# Patient Record
Sex: Male | Born: 1965 | Race: Black or African American | Hispanic: No | Marital: Single | Smoking: Current every day smoker
Health system: Southern US, Community
[De-identification: ages and names within clinical notes are randomized; demographics above are authoritative.]

---

## 2019-08-31 ENCOUNTER — Other Ambulatory Visit: Payer: Self-pay

## 2019-08-31 DIAGNOSIS — Z20822 Contact with and (suspected) exposure to covid-19: Secondary | ICD-10-CM

## 2019-09-03 LAB — NOVEL CORONAVIRUS, NAA: SARS-CoV-2, NAA: NOT DETECTED

## 2019-09-06 ENCOUNTER — Telehealth: Payer: Self-pay | Admitting: General Practice

## 2019-09-06 NOTE — Telephone Encounter (Signed)
Negative COVID results given. Patient results "NOT Detected." Caller expressed understanding. ° °

## 2020-05-30 ENCOUNTER — Inpatient Hospital Stay (HOSPITAL_COMMUNITY)
Admission: EM | Admit: 2020-05-30 | Discharge: 2020-06-06 | DRG: 488 | Disposition: A | Discharge status: Skilled Nursing Facility | Payer: Self-pay | Attending: Student | Admitting: Student

## 2020-05-30 ENCOUNTER — Encounter (HOSPITAL_COMMUNITY): Payer: Self-pay | Admitting: Orthopedic Surgery

## 2020-05-30 ENCOUNTER — Emergency Department (HOSPITAL_COMMUNITY): Payer: Self-pay

## 2020-05-30 DIAGNOSIS — S82142A Displaced bicondylar fracture of left tibia, initial encounter for closed fracture: Principal | ICD-10-CM | POA: Diagnosis present

## 2020-05-30 DIAGNOSIS — F17200 Nicotine dependence, unspecified, uncomplicated: Secondary | ICD-10-CM

## 2020-05-30 DIAGNOSIS — Y9301 Activity, walking, marching and hiking: Secondary | ICD-10-CM | POA: Diagnosis present

## 2020-05-30 DIAGNOSIS — S83282A Other tear of lateral meniscus, current injury, left knee, initial encounter: Secondary | ICD-10-CM

## 2020-05-30 DIAGNOSIS — R52 Pain, unspecified: Secondary | ICD-10-CM

## 2020-05-30 DIAGNOSIS — S8991XA Unspecified injury of right lower leg, initial encounter: Secondary | ICD-10-CM

## 2020-05-30 DIAGNOSIS — S82839A Other fracture of upper and lower end of unspecified fibula, initial encounter for closed fracture: Secondary | ICD-10-CM | POA: Diagnosis present

## 2020-05-30 DIAGNOSIS — M25361 Other instability, right knee: Secondary | ICD-10-CM | POA: Diagnosis present

## 2020-05-30 DIAGNOSIS — Z20822 Contact with and (suspected) exposure to covid-19: Secondary | ICD-10-CM | POA: Diagnosis present

## 2020-05-30 DIAGNOSIS — Y92410 Unspecified street and highway as the place of occurrence of the external cause: Secondary | ICD-10-CM

## 2020-05-30 DIAGNOSIS — T1490XA Injury, unspecified, initial encounter: Secondary | ICD-10-CM

## 2020-05-30 DIAGNOSIS — S8251XA Displaced fracture of medial malleolus of right tibia, initial encounter for closed fracture: Secondary | ICD-10-CM

## 2020-05-30 DIAGNOSIS — Z560 Unemployment, unspecified: Secondary | ICD-10-CM

## 2020-05-30 DIAGNOSIS — F17210 Nicotine dependence, cigarettes, uncomplicated: Secondary | ICD-10-CM | POA: Diagnosis present

## 2020-05-30 DIAGNOSIS — S82141A Displaced bicondylar fracture of right tibia, initial encounter for closed fracture: Secondary | ICD-10-CM | POA: Diagnosis present

## 2020-05-30 DIAGNOSIS — T148XXA Other injury of unspecified body region, initial encounter: Secondary | ICD-10-CM

## 2020-05-30 LAB — COMPREHENSIVE METABOLIC PANEL
ALT: 35 U/L (ref 0–44)
AST: 45 U/L — ABNORMAL HIGH (ref 15–41)
Albumin: 3.7 g/dL (ref 3.5–5.0)
Alkaline Phosphatase: 103 U/L (ref 38–126)
Anion gap: 11 (ref 5–15)
BUN: 5 mg/dL — ABNORMAL LOW (ref 6–20)
CO2: 22 mmol/L (ref 22–32)
Calcium: 8.7 mg/dL — ABNORMAL LOW (ref 8.9–10.3)
Chloride: 105 mmol/L (ref 98–111)
Creatinine, Ser: 0.93 mg/dL (ref 0.61–1.24)
GFR calc Af Amer: >60 mL/min (ref >60)
GFR calc non Af Amer: >60 mL/min (ref >60)
Glucose, Bld: 128 mg/dL — ABNORMAL HIGH (ref 70–99)
Potassium: 3.7 mmol/L (ref 3.5–5.1)
Sodium: 138 mmol/L (ref 135–145)
Total Bilirubin: 0.7 mg/dL (ref 0.3–1.2)
Total Protein: 7.1 g/dL (ref 6.5–8.1)

## 2020-05-30 LAB — CBC WITH DIFFERENTIAL/PLATELET
Abs Immature Granulocytes: 0.1 10*3/uL — ABNORMAL HIGH (ref 0.00–0.07)
Basophils Absolute: 0.1 10*3/uL (ref 0.0–0.1)
Basophils Relative: 0 %
Eosinophils Absolute: 0.2 10*3/uL (ref 0.0–0.5)
Eosinophils Relative: 2 %
HCT: 41.7 % (ref 39.0–52.0)
Hemoglobin: 13 g/dL (ref 13.0–17.0)
Immature Granulocytes: 1 %
Lymphocytes Relative: 33 %
Lymphs Abs: 4.5 10*3/uL — ABNORMAL HIGH (ref 0.7–4.0)
MCH: 25.4 pg — ABNORMAL LOW (ref 26.0–34.0)
MCHC: 31.2 g/dL (ref 30.0–36.0)
MCV: 81.4 fL (ref 80.0–100.0)
Monocytes Absolute: 0.8 10*3/uL (ref 0.1–1.0)
Monocytes Relative: 6 %
Neutro Abs: 7.8 10*3/uL — ABNORMAL HIGH (ref 1.7–7.7)
Neutrophils Relative %: 58 %
Platelets: 119 10*3/uL — ABNORMAL LOW (ref 150–400)
RBC: 5.12 MIL/uL (ref 4.22–5.81)
RDW: 15.4 % (ref 11.5–15.5)
WBC: 13.4 10*3/uL — ABNORMAL HIGH (ref 4.0–10.5)
nRBC: 0 % (ref 0.0–0.2)

## 2020-05-30 LAB — SURGICAL PCR SCREEN
MRSA, PCR: NEGATIVE
Staphylococcus aureus: NEGATIVE

## 2020-05-30 LAB — SARS CORONAVIRUS 2 BY RT PCR (HOSPITAL ORDER, PERFORMED IN ~~LOC~~ HOSPITAL LAB): SARS Coronavirus 2: NEGATIVE

## 2020-05-30 MED ORDER — MORPHINE SULFATE (PF) 4 MG/ML IV SOLN
4.0000 mg | Freq: Once | INTRAVENOUS | Status: AC
  Administered 2020-05-30: 4 mg via INTRAVENOUS
  Filled 2020-05-30: qty 1

## 2020-05-30 MED ORDER — ONDANSETRON HCL 4 MG PO TABS: 4.0000 mg | ORAL_TABLET | Freq: Four times a day (QID) | ORAL | Status: DC | PRN

## 2020-05-30 MED ORDER — OXYCODONE HCL 5 MG PO TABS
5.0000 mg | ORAL_TABLET | ORAL | Status: DC | PRN
  Administered 2020-05-30: 15 mg via ORAL
  Administered 2020-05-30: 10 mg via ORAL
  Administered 2020-05-30: 15 mg via ORAL
  Administered 2020-05-31: 10 mg via ORAL
  Administered 2020-05-31 (×2): 15 mg via ORAL
  Administered 2020-06-01 (×3): 10 mg via ORAL
  Administered 2020-06-01: 15 mg via ORAL
  Administered 2020-06-02: 10 mg via ORAL
  Administered 2020-06-02 – 2020-06-03 (×2): 15 mg via ORAL
  Administered 2020-06-03 – 2020-06-05 (×4): 10 mg via ORAL
  Filled 2020-05-30 (×3): qty 3
  Filled 2020-05-30 (×3): qty 2
  Filled 2020-05-30: qty 3
  Filled 2020-05-30: qty 2
  Filled 2020-05-30: qty 3
  Filled 2020-05-30: qty 2
  Filled 2020-05-30: qty 3
  Filled 2020-05-30 (×2): qty 2
  Filled 2020-05-30: qty 3
  Filled 2020-05-30: qty 2
  Filled 2020-05-30: qty 3
  Filled 2020-05-30: qty 2

## 2020-05-30 MED ORDER — ENSURE PRE-SURGERY PO LIQD
296.0000 mL | Freq: Once | ORAL | Status: DC
  Filled 2020-05-30: qty 296

## 2020-05-30 MED ORDER — CHLORHEXIDINE GLUCONATE 4 % EX LIQD
60.0000 mL | Freq: Once | CUTANEOUS | Status: AC
  Administered 2020-05-31: 4 via TOPICAL
  Filled 2020-05-30: qty 60

## 2020-05-30 MED ORDER — MORPHINE SULFATE (PF) 2 MG/ML IV SOLN
2.0000 mg | INTRAVENOUS | Status: DC | PRN
  Administered 2020-05-30 – 2020-06-01 (×3): 2 mg via INTRAVENOUS
  Filled 2020-05-30 (×3): qty 1

## 2020-05-30 MED ORDER — METHOCARBAMOL 500 MG PO TABS
500.0000 mg | ORAL_TABLET | Freq: Four times a day (QID) | ORAL | Status: DC | PRN
  Administered 2020-05-31 – 2020-06-05 (×4): 500 mg via ORAL
  Filled 2020-05-30 (×4): qty 1

## 2020-05-30 MED ORDER — CEFAZOLIN SODIUM-DEXTROSE 2-4 GM/100ML-% IV SOLN
2.0000 g | INTRAVENOUS | Status: AC
  Administered 2020-05-31: 2 g via INTRAVENOUS
  Filled 2020-05-30: qty 100

## 2020-05-30 MED ORDER — ONDANSETRON HCL 4 MG/2ML IJ SOLN: 4.0000 mg | Freq: Four times a day (QID) | INTRAMUSCULAR | Status: DC | PRN

## 2020-05-30 MED ORDER — METHOCARBAMOL 1000 MG/10ML IJ SOLN
500.0000 mg | Freq: Four times a day (QID) | INTRAVENOUS | Status: DC | PRN
  Filled 2020-05-30: qty 5

## 2020-05-30 MED ORDER — POVIDONE-IODINE 10 % EX SWAB: 2.0000 "application " | Freq: Once | CUTANEOUS | Status: DC

## 2020-05-30 MED ORDER — ENOXAPARIN SODIUM 40 MG/0.4ML ~~LOC~~ SOLN
40.0000 mg | SUBCUTANEOUS | Status: DC
  Administered 2020-05-30: 40 mg via SUBCUTANEOUS
  Filled 2020-05-30: qty 0.4

## 2020-05-30 NOTE — Progress Notes (Signed)
Orthopedic Tech Progress Note Patient Details:  Aaron Mcmillan 05/04/1966 [id number]  Ortho Devices Type of Ortho Device: Knee Immobilizer, CAM walker Ortho Device/Splint Location: RLE Ortho Device/Splint Interventions: Ordered, Application   Post Interventions Patient Tolerated: Well Instructions Provided: Care of device   [name] 05/30/2020, 3:13 PM

## 2020-05-30 NOTE — ED Notes (Signed)
Ortho at bedside.
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
## 2020-05-30 NOTE — Plan of Care (Signed)
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
## 2020-05-30 NOTE — ED Notes (Signed)
Patient transported to CT 

## 2020-05-31 ENCOUNTER — Inpatient Hospital Stay (HOSPITAL_COMMUNITY): Payer: Self-pay | Admitting: Certified Registered"

## 2020-05-31 ENCOUNTER — Encounter (HOSPITAL_COMMUNITY): Payer: Self-pay | Admitting: Student

## 2020-05-31 ENCOUNTER — Inpatient Hospital Stay (HOSPITAL_COMMUNITY): Payer: Self-pay

## 2020-05-31 ENCOUNTER — Encounter (HOSPITAL_COMMUNITY)
Admission: EM | Disposition: A | Discharge status: Skilled Nursing Facility | Payer: Self-pay | Source: Home / Self Care | Attending: Student

## 2020-05-31 ENCOUNTER — Other Ambulatory Visit: Payer: Self-pay

## 2020-05-31 DIAGNOSIS — S83282A Other tear of lateral meniscus, current injury, left knee, initial encounter: Secondary | ICD-10-CM

## 2020-05-31 DIAGNOSIS — F17200 Nicotine dependence, unspecified, uncomplicated: Secondary | ICD-10-CM

## 2020-05-31 DIAGNOSIS — S8251XA Displaced fracture of medial malleolus of right tibia, initial encounter for closed fracture: Secondary | ICD-10-CM

## 2020-05-31 DIAGNOSIS — S8991XA Unspecified injury of right lower leg, initial encounter: Secondary | ICD-10-CM

## 2020-05-31 HISTORY — PX: ORIF TIBIA PLATEAU: SHX2132

## 2020-05-31 LAB — VITAMIN D 25 HYDROXY (VIT D DEFICIENCY, FRACTURES): Vit D, 25-Hydroxy: 12.44 ng/mL — ABNORMAL LOW (ref 30–100)

## 2020-05-31 LAB — HIV ANTIBODY (ROUTINE TESTING W REFLEX): HIV Screen 4th Generation wRfx: NONREACTIVE

## 2020-05-31 SURGERY — OPEN REDUCTION INTERNAL FIXATION (ORIF) TIBIAL PLATEAU
Anesthesia: General | Laterality: Left

## 2020-05-31 MED ORDER — 0.9 % SODIUM CHLORIDE (POUR BTL) OPTIME
TOPICAL | Status: DC | PRN
  Administered 2020-05-31: 1000 mL

## 2020-05-31 MED ORDER — ALBUMIN HUMAN 5 % IV SOLN: INTRAVENOUS | Status: DC | PRN

## 2020-05-31 MED ORDER — ONDANSETRON HCL 4 MG/2ML IJ SOLN
INTRAMUSCULAR | Status: DC | PRN
  Administered 2020-05-31: 4 mg via INTRAVENOUS

## 2020-05-31 MED ORDER — PHENYLEPHRINE HCL (PRESSORS) 10 MG/ML IV SOLN
INTRAVENOUS | Status: DC | PRN
  Administered 2020-05-31: 80 ug via INTRAVENOUS

## 2020-05-31 MED ORDER — HYDROMORPHONE HCL 1 MG/ML IJ SOLN
INTRAMUSCULAR | Status: DC | PRN
Start: 2020-05-31 — End: 2020-05-31
  Administered 2020-05-31: .5 mg via INTRAVENOUS

## 2020-05-31 MED ORDER — OXYCODONE HCL 5 MG PO TABS
ORAL_TABLET | ORAL | Status: AC
  Filled 2020-05-31: qty 1

## 2020-05-31 MED ORDER — SUGAMMADEX SODIUM 200 MG/2ML IV SOLN
INTRAVENOUS | Status: DC | PRN
  Administered 2020-05-31: 300 mg via INTRAVENOUS

## 2020-05-31 MED ORDER — LACTATED RINGERS IV SOLN: INTRAVENOUS | Status: DC

## 2020-05-31 MED ORDER — ACETAMINOPHEN 10 MG/ML IV SOLN: 1000.0000 mg | Freq: Once | INTRAVENOUS | Status: DC | PRN

## 2020-05-31 MED ORDER — MIDAZOLAM HCL 2 MG/2ML IJ SOLN
INTRAMUSCULAR | Status: DC | PRN
  Administered 2020-05-31: 2 mg via INTRAVENOUS

## 2020-05-31 MED ORDER — FENTANYL CITRATE (PF) 100 MCG/2ML IJ SOLN
25.0000 ug | INTRAMUSCULAR | Status: DC | PRN
  Administered 2020-05-31 (×2): 50 ug via INTRAVENOUS

## 2020-05-31 MED ORDER — MIDAZOLAM HCL 2 MG/2ML IJ SOLN
INTRAMUSCULAR | Status: AC
  Filled 2020-05-31: qty 2

## 2020-05-31 MED ORDER — HYDROMORPHONE HCL 1 MG/ML IJ SOLN
INTRAMUSCULAR | Status: AC
  Filled 2020-05-31: qty 0.5

## 2020-05-31 MED ORDER — DEXMEDETOMIDINE (PRECEDEX) IN NS 20 MCG/5ML (4 MCG/ML) IV SYRINGE
PREFILLED_SYRINGE | INTRAVENOUS | Status: DC | PRN
  Administered 2020-05-31: 12 ug via INTRAVENOUS

## 2020-05-31 MED ORDER — LABETALOL HCL 5 MG/ML IV SOLN: 10.0000 mg | Freq: Once | INTRAVENOUS | Status: DC

## 2020-05-31 MED ORDER — LABETALOL HCL 5 MG/ML IV SOLN
10.0000 mg | INTRAVENOUS | Status: DC | PRN
  Administered 2020-05-31: 10 mg via INTRAVENOUS

## 2020-05-31 MED ORDER — ROCURONIUM BROMIDE 10 MG/ML (PF) SYRINGE
PREFILLED_SYRINGE | INTRAVENOUS | Status: DC | PRN
  Administered 2020-05-31 (×2): 20 mg via INTRAVENOUS
  Administered 2020-05-31: 50 mg via INTRAVENOUS

## 2020-05-31 MED ORDER — OXYCODONE HCL 5 MG PO TABS
5.0000 mg | ORAL_TABLET | Freq: Once | ORAL | Status: AC | PRN
  Administered 2020-05-31: 5 mg via ORAL

## 2020-05-31 MED ORDER — LABETALOL HCL 5 MG/ML IV SOLN
INTRAVENOUS | Status: AC
  Filled 2020-05-31: qty 4

## 2020-05-31 MED ORDER — PROMETHAZINE HCL 25 MG/ML IJ SOLN: 6.2500 mg | INTRAMUSCULAR | Status: DC | PRN

## 2020-05-31 MED ORDER — CHLORHEXIDINE GLUCONATE 0.12 % MT SOLN
15.0000 mL | OROMUCOSAL | Status: AC
  Administered 2020-05-31: 15 mL via OROMUCOSAL
  Filled 2020-05-31: qty 15

## 2020-05-31 MED ORDER — LIDOCAINE 2% (20 MG/ML) 5 ML SYRINGE
INTRAMUSCULAR | Status: DC | PRN
  Administered 2020-05-31: 100 mg via INTRAVENOUS

## 2020-05-31 MED ORDER — OXYCODONE HCL 5 MG/5ML PO SOLN: 5.0000 mg | Freq: Once | ORAL | Status: AC | PRN

## 2020-05-31 MED ORDER — CEFAZOLIN SODIUM-DEXTROSE 2-4 GM/100ML-% IV SOLN
2.0000 g | Freq: Three times a day (TID) | INTRAVENOUS | Status: AC
  Administered 2020-05-31 – 2020-06-01 (×3): 2 g via INTRAVENOUS
  Filled 2020-05-31 (×3): qty 100

## 2020-05-31 MED ORDER — VANCOMYCIN HCL 1000 MG IV SOLR
INTRAVENOUS | Status: DC | PRN
  Administered 2020-05-31: 1000 mg

## 2020-05-31 MED ORDER — ENOXAPARIN SODIUM 40 MG/0.4ML ~~LOC~~ SOLN
40.0000 mg | SUBCUTANEOUS | Status: DC
  Administered 2020-06-01 – 2020-06-06 (×6): 40 mg via SUBCUTANEOUS
  Filled 2020-05-31 (×6): qty 0.4

## 2020-05-31 MED ORDER — DEXAMETHASONE SODIUM PHOSPHATE 10 MG/ML IJ SOLN
INTRAMUSCULAR | Status: DC | PRN
  Administered 2020-05-31: 4 mg via INTRAVENOUS

## 2020-05-31 MED ORDER — FENTANYL CITRATE (PF) 250 MCG/5ML IJ SOLN
INTRAMUSCULAR | Status: DC | PRN
Start: 2020-05-31 — End: 2020-05-31
  Administered 2020-05-31 (×5): 50 ug via INTRAVENOUS

## 2020-05-31 MED ORDER — PROPOFOL 10 MG/ML IV BOLUS
INTRAVENOUS | Status: DC | PRN
  Administered 2020-05-31: 50 mg via INTRAVENOUS
  Administered 2020-05-31: 150 mg via INTRAVENOUS

## 2020-05-31 MED ORDER — FENTANYL CITRATE (PF) 100 MCG/2ML IJ SOLN
INTRAMUSCULAR | Status: AC
  Filled 2020-05-31: qty 2

## 2020-05-31 MED ORDER — TOBRAMYCIN SULFATE 1.2 G IJ SOLR
INTRAMUSCULAR | Status: AC
  Filled 2020-05-31: qty 1.2

## 2020-05-31 MED ORDER — PROPOFOL 10 MG/ML IV BOLUS
INTRAVENOUS | Status: AC
  Filled 2020-05-31: qty 20

## 2020-05-31 MED ORDER — FENTANYL CITRATE (PF) 250 MCG/5ML IJ SOLN
INTRAMUSCULAR | Status: AC
  Filled 2020-05-31: qty 5

## 2020-05-31 MED ORDER — VANCOMYCIN HCL 1000 MG IV SOLR
INTRAVENOUS | Status: AC
  Filled 2020-05-31: qty 1000

## 2020-05-31 MED ORDER — ROCURONIUM BROMIDE 10 MG/ML (PF) SYRINGE
PREFILLED_SYRINGE | INTRAVENOUS | Status: AC
  Filled 2020-05-31: qty 10

## 2020-05-31 SURGICAL SUPPLY — 83 items
BANDAGE ESMARK 6X9 LF (GAUZE/BANDAGES/DRESSINGS) ×1 IMPLANT
BIT DRILL 1.5 (BIT) ×1 IMPLANT
BIT DRILL 2.8X200 (BIT) ×2 IMPLANT
BIT DRILL CALIBR QC 2.8X250 (BIT) ×2 IMPLANT
BIT DRILL QC 2.5X240 (BIT) ×2 IMPLANT
BLADE CLIPPER SURG (BLADE) IMPLANT
BLADE SURG 15 STRL LF DISP TIS (BLADE) ×1 IMPLANT
BLADE SURG 15 STRL SS (BLADE) ×1
BNDG ELASTIC 4X5.8 VLCR STR LF (GAUZE/BANDAGES/DRESSINGS) ×2 IMPLANT
BNDG ELASTIC 6X5.8 VLCR STR LF (GAUZE/BANDAGES/DRESSINGS) ×2 IMPLANT
BNDG ESMARK 6X9 LF (GAUZE/BANDAGES/DRESSINGS) ×2
BNDG GAUZE ELAST 4 BULKY (GAUZE/BANDAGES/DRESSINGS) ×2 IMPLANT
BONE CANC CHIPS 20CC PCAN1/4 (Bone Implant) ×2 IMPLANT
BRUSH SCRUB EZ PLAIN DRY (MISCELLANEOUS) ×4 IMPLANT
CANISTER SUCT 3000ML PPV (MISCELLANEOUS) ×2 IMPLANT
CHIPS CANC BONE 20CC PCAN1/4 (Bone Implant) ×1 IMPLANT
CHLORAPREP W/TINT 26 (MISCELLANEOUS) ×4 IMPLANT
COVER SURGICAL LIGHT HANDLE (MISCELLANEOUS) ×2 IMPLANT
COVER WAND RF STERILE (DRAPES) ×2 IMPLANT
CUFF TOURN SGL QUICK 34 (TOURNIQUET CUFF) ×1
CUFF TRNQT CYL 34X4.125X (TOURNIQUET CUFF) ×1 IMPLANT
DRAPE C-ARM 42X72 X-RAY (DRAPES) ×2 IMPLANT
DRAPE C-ARMOR (DRAPES) ×2 IMPLANT
DRAPE ORTHO SPLIT 77X108 STRL (DRAPES) ×2
DRAPE SURG ORHT 6 SPLT 77X108 (DRAPES) ×2 IMPLANT
DRAPE U-SHAPE 47X51 STRL (DRAPES) ×2 IMPLANT
DRILL BIT 1.5 (BIT) ×1
DRSG MEPITEL 4X7.2 (GAUZE/BANDAGES/DRESSINGS) ×2 IMPLANT
DRSG PAD ABDOMINAL 8X10 ST (GAUZE/BANDAGES/DRESSINGS) ×4 IMPLANT
ELECT REM PT RETURN 9FT ADLT (ELECTROSURGICAL) ×2
ELECTRODE REM PT RTRN 9FT ADLT (ELECTROSURGICAL) ×1 IMPLANT
GAUZE SPONGE 4X4 12PLY STRL (GAUZE/BANDAGES/DRESSINGS) ×2 IMPLANT
GLOVE BIO SURGEON STRL SZ 6.5 (GLOVE) ×6 IMPLANT
GLOVE BIO SURGEON STRL SZ7.5 (GLOVE) ×8 IMPLANT
GLOVE BIOGEL PI IND STRL 6.5 (GLOVE) ×1 IMPLANT
GLOVE BIOGEL PI IND STRL 7.5 (GLOVE) ×1 IMPLANT
GLOVE BIOGEL PI INDICATOR 6.5 (GLOVE) ×1
GLOVE BIOGEL PI INDICATOR 7.5 (GLOVE) ×1
GOWN STRL REUS W/ TWL LRG LVL3 (GOWN DISPOSABLE) ×2 IMPLANT
GOWN STRL REUS W/TWL LRG LVL3 (GOWN DISPOSABLE) ×2
IMMOBILIZER KNEE 22 UNIV (SOFTGOODS) ×2 IMPLANT
KIT BASIN OR (CUSTOM PROCEDURE TRAY) ×2 IMPLANT
KIT TURNOVER KIT B (KITS) ×2 IMPLANT
NDL SUT 6 .5 CRC .975X.05 MAYO (NEEDLE) ×1 IMPLANT
NEEDLE MAYO TAPER (NEEDLE) ×1
NS IRRIG 1000ML POUR BTL (IV SOLUTION) ×2 IMPLANT
PACK TOTAL JOINT (CUSTOM PROCEDURE TRAY) ×2 IMPLANT
PAD ARMBOARD 7.5X6 YLW CONV (MISCELLANEOUS) ×4 IMPLANT
PAD CAST 4YDX4 CTTN HI CHSV (CAST SUPPLIES) ×1 IMPLANT
PADDING CAST ABS 6INX4YD NS (CAST SUPPLIES) ×1
PADDING CAST ABS COTTON 6X4 NS (CAST SUPPLIES) ×1 IMPLANT
PADDING CAST COTTON 4X4 STRL (CAST SUPPLIES) ×1
PADDING CAST COTTON 6X4 STRL (CAST SUPPLIES) ×2 IMPLANT
PLATE CONDYLAR 2.0 7 HOLE (Plate) ×2 IMPLANT
PLATE TIBIA LG BEND (Plate) ×2 IMPLANT
SCREW CORT 3.5X46M SELF TAP (Screw) ×2 IMPLANT
SCREW CORTEX 3.5 30MM (Screw) ×1 IMPLANT
SCREW CORTEX 3.5 32MM (Screw) ×1 IMPLANT
SCREW CORTEX SLFTPNG 40MM (Screw) ×4 IMPLANT
SCREW HEADED ST 3.5X64 (Screw) ×2 IMPLANT
SCREW LOCK CORT ST 3.5X30 (Screw) ×1 IMPLANT
SCREW LOCK CORT ST 3.5X32 (Screw) ×1 IMPLANT
SCREW LOCKING 3.5X80MM VA (Screw) ×4 IMPLANT
SCREW LOCKING SLFTPNG 30MM (Screw) ×4 IMPLANT
SCREW LOCKING VA 3.5X75MM (Screw) ×2 IMPLANT
STAPLER VISISTAT 35W (STAPLE) ×2 IMPLANT
SUCTION FRAZIER HANDLE 10FR (MISCELLANEOUS) ×1
SUCTION TUBE FRAZIER 10FR DISP (MISCELLANEOUS) ×1 IMPLANT
SUT ETHILON 2 0 FS 18 (SUTURE) ×2 IMPLANT
SUT ETHILON 3 0 PS 1 (SUTURE) IMPLANT
SUT FIBERWIRE #2 38 T-5 BLUE (SUTURE)
SUT VIC AB 0 CT1 27 (SUTURE)
SUT VIC AB 0 CT1 27XBRD ANBCTR (SUTURE) IMPLANT
SUT VIC AB 1 CT1 18XCR BRD 8 (SUTURE) IMPLANT
SUT VIC AB 1 CT1 27 (SUTURE) ×1
SUT VIC AB 1 CT1 27XBRD ANBCTR (SUTURE) ×1 IMPLANT
SUT VIC AB 1 CT1 8-18 (SUTURE)
SUT VIC AB 2-0 CT1 27 (SUTURE) ×2
SUT VIC AB 2-0 CT1 TAPERPNT 27 (SUTURE) ×2 IMPLANT
SUTURE FIBERWR #2 38 T-5 BLUE (SUTURE) IMPLANT
TOWEL GREEN STERILE (TOWEL DISPOSABLE) ×4 IMPLANT
TRAY FOLEY MTR SLVR 16FR STAT (SET/KITS/TRAYS/PACK) IMPLANT
WATER STERILE IRR 1000ML POUR (IV SOLUTION) ×4 IMPLANT

## 2020-05-31 NOTE — Transfer of Care (Signed)
Immediate Anesthesia Transfer of Care Note  Patient: Aaron Mcmillan  Procedure(s) Performed: OPEN REDUCTION INTERNAL FIXATION (ORIF) TIBIAL PLATEAU (Left )  Patient Location: PACU  Anesthesia Type:General  Level of Consciousness: awake, alert  and oriented  Airway & Oxygen Therapy: Patient Spontanous Breathing and Patient connected to face mask oxygen  Post-op Assessment: Report given to RN, Post -op Vital signs reviewed and stable and Patient moving all extremities  Post vital signs: Reviewed and stable  Last Vitals:  Vitals Value Taken Time  BP 165/101 05/31/20 1718  Temp    Pulse 102 05/31/20 1718  Resp 15 05/31/20 1718  SpO2 100 % 05/31/20 1718  Vitals shown include unvalidated device data.  Last Pain:  Vitals:   05/31/20 0838  TempSrc:   PainSc: 8       Patients Stated Pain Goal: 3 (05/31/20 0325)  Complications: No complications documented.
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
## 2020-05-31 NOTE — Anesthesia Preprocedure Evaluation (Signed)
Anesthesia Evaluation  Patient identified by MRN, date of birth, ID band Patient awake    Reviewed: Allergy & Precautions, NPO status , Patient's Chart, lab work & pertinent test results  History of Anesthesia Complications Negative for: history of anesthetic complications  Airway Mallampati: II  TM Distance: >3 FB Neck ROM: Full    Dental  (+) Missing,    Pulmonary Current Smoker,    Pulmonary exam normal        Cardiovascular negative cardio ROS Normal cardiovascular exam     Neuro/Psych negative neurological ROS  negative psych ROS   GI/Hepatic negative GI ROS, Neg liver ROS,   Endo/Other  negative endocrine ROS  Renal/GU negative Renal ROS  negative genitourinary   Musculoskeletal Left tibia plateau fx   Abdominal   Peds  Hematology negative hematology ROS (+)   Anesthesia Other Findings Day of surgery medications reviewed with patient.  Reproductive/Obstetrics negative OB ROS                             Anesthesia Physical Anesthesia Plan  ASA: II  Anesthesia Plan: General   Post-op Pain Management:    Induction: Intravenous  PONV Risk Score and Plan: 3 and Treatment may vary due to age or medical condition, Ondansetron, Dexamethasone and Midazolam  Airway Management Planned: Oral ETT  Additional Equipment: None  Intra-op Plan:   Post-operative Plan: Extubation in OR  Informed Consent: I have reviewed the patients History and Physical, chart, labs and discussed the procedure including the risks, benefits and alternatives for the proposed anesthesia with the patient or authorized representative who has indicated his/her understanding and acceptance.     Dental advisory given  Plan Discussed with: CRNA  Anesthesia Plan Comments:         Anesthesia Quick Evaluation

## 2020-05-31 NOTE — Progress Notes (Addendum)
Pt is A&O x4,  NPO post 0430 maint. CHG bath completed. Report given to Darl Pikes at Short stay. Pt to short stay. 1845 Received pt back from PACU. A&O x4. LLE with ace wrap dry and intact. Ice pack in place. Denies pain at this time.

## 2020-05-31 NOTE — Anesthesia Procedure Notes (Signed)
Procedure Name: Intubation Date/Time: 05/31/2020 3:02 PM Performed by: Amadeo Garnet, CRNA Pre-anesthesia Checklist: Patient identified, Emergency Drugs available, Suction available and Patient being monitored Patient Re-evaluated:Patient Re-evaluated prior to induction Oxygen Delivery Method: Circle system utilized Preoxygenation: Pre-oxygenation with 100% oxygen Induction Type: IV induction Ventilation: Mask ventilation without difficulty Laryngoscope Size: Mac and 4 Grade View: Grade I Tube type: Oral Tube size: 7.5 mm Number of attempts: 1 Airway Equipment and Method: Stylet Placement Confirmation: ETT inserted through vocal cords under direct vision,  positive ETCO2 and breath sounds checked- equal and bilateral Secured at: 22 cm Tube secured with: Tape Dental Injury: Teeth and Oropharynx as per pre-operative assessment

## 2020-05-31 NOTE — Anesthesia Postprocedure Evaluation (Signed)
Anesthesia Post Note  Patient: Aaron Mcmillan  Procedure(s) Performed: OPEN REDUCTION INTERNAL FIXATION (ORIF) TIBIAL PLATEAU (Left )     Patient location during evaluation: PACU Anesthesia Type: General Level of consciousness: awake and alert Pain management: pain level controlled Vital Signs Assessment: post-procedure vital signs reviewed and stable Respiratory status: spontaneous breathing, nonlabored ventilation, respiratory function stable and patient connected to nasal cannula oxygen Cardiovascular status: blood pressure returned to baseline and stable Postop Assessment: no apparent nausea or vomiting Anesthetic complications: no   No complications documented.  Last Vitals:  Vitals:   05/31/20 0325 05/31/20 0807  BP: (!) 113/92 (!) 146/96  Pulse: 91 (!) 109  Resp: 19 17  Temp: 36.7 C 37 C  SpO2: 94% 97%    Last Pain:  Vitals:   05/31/20 0838  TempSrc:   PainSc: 8                  Jayquon Theiler DAVID

## 2020-05-31 NOTE — H&P (Signed)
Please see Consult note from 05/30/20 for full orthopaedic H&P

## 2020-06-01 ENCOUNTER — Encounter (HOSPITAL_COMMUNITY): Payer: Self-pay | Admitting: Student

## 2020-06-01 ENCOUNTER — Ambulatory Visit: Payer: Self-pay | Attending: Student

## 2020-06-01 DIAGNOSIS — Z23 Encounter for immunization: Secondary | ICD-10-CM

## 2020-06-01 LAB — CBC
HCT: 37 % — ABNORMAL LOW (ref 39.0–52.0)
Hemoglobin: 11.8 g/dL — ABNORMAL LOW (ref 13.0–17.0)
MCH: 25.3 pg — ABNORMAL LOW (ref 26.0–34.0)
MCHC: 31.9 g/dL (ref 30.0–36.0)
MCV: 79.2 fL — ABNORMAL LOW (ref 80.0–100.0)
Platelets: 121 10*3/uL — ABNORMAL LOW (ref 150–400)
RBC: 4.67 MIL/uL (ref 4.22–5.81)
RDW: 14.8 % (ref 11.5–15.5)
WBC: 9.2 10*3/uL (ref 4.0–10.5)
nRBC: 0 % (ref 0.0–0.2)

## 2020-06-01 MED ORDER — ACETAMINOPHEN 325 MG PO TABS
650.0000 mg | ORAL_TABLET | Freq: Four times a day (QID) | ORAL | Status: DC
  Administered 2020-06-01 – 2020-06-06 (×21): 650 mg via ORAL
  Filled 2020-06-01 (×22): qty 2

## 2020-06-01 MED ORDER — VITAMIN D (ERGOCALCIFEROL) 1.25 MG (50000 UNIT) PO CAPS
50000.0000 [IU] | ORAL_CAPSULE | ORAL | Status: DC
  Administered 2020-06-01: 50000 [IU] via ORAL
  Filled 2020-06-01: qty 1

## 2020-06-01 MED ORDER — VITAMIN D 25 MCG (1000 UNIT) PO TABS
2000.0000 [IU] | ORAL_TABLET | Freq: Two times a day (BID) | ORAL | Status: DC
  Administered 2020-06-01 – 2020-06-06 (×11): 2000 [IU] via ORAL
  Filled 2020-06-01 (×11): qty 2
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
## 2020-06-01 NOTE — Plan of Care (Signed)
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
## 2020-06-01 NOTE — Plan of Care (Signed)

## 2020-06-01 NOTE — Progress Notes (Signed)
Inpatient Rehab Admissions Coordinator Note:   Per therapy recommendations, pt was screened for CIR candidacy by Estill Dooms, PT, DPT.  At this time we are recommending a CIR consult.  If pt would like to be considered, please place an IP Rehab MD Consult order.  Please contact me with questions.   Estill Dooms, PT, DPT [phone] 06/01/20 4:27 PM

## 2020-06-01 NOTE — Progress Notes (Signed)
   Covid-19 Vaccination Clinic  Name:  Aaron Mcmillan    MRN: [id number] DOB: January 18, 1966  06/01/2020  Aaron Mcmillan was observed post Covid-19 immunization for 15 minutes without incident. He was provided with Vaccine Information Sheet and instruction to access the V-Safe system.   Aaron Mcmillan was instructed to call 911 with any severe reactions post vaccine: Marland Kitchen Difficulty breathing  . Swelling of face and throat  . A fast heartbeat  . A bad rash all over body  . Dizziness and weakness   Immunizations Administered    Name Date Dose VIS Date Route   JANSSEN COVID-19 VACCINE 06/01/2020 11:35 AM 0.5 mL 11/27/2019 Intramuscular   Manufacturer: [employer]   Lot: [id number]   NDC: 11572-620-35

## 2020-06-02 LAB — CBC
HCT: 36.4 % — ABNORMAL LOW (ref 39.0–52.0)
HCT: 36.7 % — ABNORMAL LOW (ref 39.0–52.0)
Hemoglobin: 11.4 g/dL — ABNORMAL LOW (ref 13.0–17.0)
Hemoglobin: 11.7 g/dL — ABNORMAL LOW (ref 13.0–17.0)
MCH: 24.8 pg — ABNORMAL LOW (ref 26.0–34.0)
MCH: 25.4 pg — ABNORMAL LOW (ref 26.0–34.0)
MCHC: 31.3 g/dL (ref 30.0–36.0)
MCHC: 31.9 g/dL (ref 30.0–36.0)
MCV: 79.3 fL — ABNORMAL LOW (ref 80.0–100.0)
MCV: 79.8 fL — ABNORMAL LOW (ref 80.0–100.0)
Platelets: 128 10*3/uL — ABNORMAL LOW (ref 150–400)
Platelets: 125 10*3/uL — ABNORMAL LOW (ref 150–400)
RBC: 4.59 MIL/uL (ref 4.22–5.81)
RBC: 4.6 MIL/uL (ref 4.22–5.81)
RDW: 14.8 % (ref 11.5–15.5)
RDW: 14.8 % (ref 11.5–15.5)
WBC: 7.9 10*3/uL (ref 4.0–10.5)
WBC: 7.9 10*3/uL (ref 4.0–10.5)
nRBC: 0 % (ref 0.0–0.2)
nRBC: 0 % (ref 0.0–0.2)

## 2020-06-02 MED ORDER — HYDRALAZINE HCL 10 MG PO TABS: 10.0000 mg | ORAL_TABLET | Freq: Four times a day (QID) | ORAL | Status: DC | PRN

## 2020-06-02 NOTE — Plan of Care (Signed)
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
## 2020-06-02 NOTE — Plan of Care (Signed)
  Problem: Activity: Goal: Risk for activity intolerance will decrease Outcome: Progressing   Problem: Coping: Goal: Level of anxiety will decrease Outcome: Progressing   Problem: Pain Managment: Goal: General experience of comfort will improve Outcome: Progressing   Problem: Safety: Goal: Ability to remain free from injury will improve Outcome: Progressing   Problem: Skin Integrity: Goal: Risk for impaired skin integrity will decrease Outcome: Progressing   

## 2020-06-02 NOTE — TOC CAGE-AID Note (Signed)
Transition of Care [facility]) - CAGE-AID Screening   Patient Details  Name: Aaron Mcmillan MRN: [id number] Date of Birth: Feb 07, 1966  [facility] CM/SW Contact:    Emeterio Reeve, [place] Phone Number: 06/02/2020, 2:16 PM   Clinical Narrative:  CSW met with pt at bedside. CSW introduced self and explained her role at the hospital.  Pt reports he drinks a beer after work a couple days a week. Pt reports he has no concerns with his alcohol consumption. Pt denies substance use. Pt did not need resources at this time.   CAGE-AID Screening:    Have You Ever Felt You Ought to Cut Down on Your Drinking or Drug Use?: No Have People Annoyed You By Critizing Your Drinking Or Drug Use?: No Have You Felt Bad Or Guilty About Your Drinking Or Drug Use?: No Have You Ever Had a Drink or Used Drugs First Thing In The Morning to Steady Your Nerves or to Get Rid of a Hangover?: No CAGE-AID Score: 0  Substance Abuse Education Offered: Yes    Blima Ledger, [place] Social Worker [phone]

## 2020-06-03 LAB — CBC
HCT: 35.3 % — ABNORMAL LOW (ref 39.0–52.0)
Hemoglobin: 11.2 g/dL — ABNORMAL LOW (ref 13.0–17.0)
MCH: 24.8 pg — ABNORMAL LOW (ref 26.0–34.0)
MCHC: 31.7 g/dL (ref 30.0–36.0)
MCV: 78.1 fL — ABNORMAL LOW (ref 80.0–100.0)
Platelets: 153 10*3/uL (ref 150–400)
RBC: 4.52 MIL/uL (ref 4.22–5.81)
RDW: 14.6 % (ref 11.5–15.5)
WBC: 6.6 10*3/uL (ref 4.0–10.5)
nRBC: 0 % (ref 0.0–0.2)

## 2020-06-03 NOTE — Progress Notes (Addendum)
Inpatient Rehab Admissions Coordinator:   I have met with this patient to discuss CIR admission at bedside. He expressed interest in CIR despite cost as a self-pay patient. However, I question medical necessity for rehab in an inpatient setting. Pt. Also does not have a stable d/c plan at this time as he was residing alone in a long-term motel prior to admission. I called and left a voicemail for Pt.'s aunt with request for call-back. If able to clarify d/c plan,  I will have rehab MD review for medical necessity and pursue admit if recommended. Recommend pursuing SNF placement as backup if CIR cannot accept.    Clemens Catholic, [place], [place] Admissions Coordinator  [phone] ([place]) [phone] (office)

## 2020-06-03 NOTE — Progress Notes (Signed)
Subjective: 3 Days Post-Op Procedure(s) (LRB): OPEN REDUCTION INTERNAL FIXATION (ORIF) TIBIAL PLATEAU (Left) Patient resting comfortably sleeping upon entry.  Objective: Vital signs in last 24 hours: Temp:  [98 F (36.7 C)-98.2 F (36.8 C)] 98.2 F (36.8 C) (09/04 0826) Pulse Rate:  [88-100] 91 (09/04 0826) Resp:  [15-19] 17 (09/04 0826) BP: (117-145)/(85-94) 117/86 (09/04 0826) SpO2:  [96 %-99 %] 99 % (09/04 0826)  Intake/Output from previous day: 09/03 0701 - 09/04 0700 In: 240 [P.O.:240] Out: 3650 [Urine:3650] Intake/Output this shift: No intake/output data recorded.  Recent Labs    06/01/20 0624 06/02/20 0401 06/03/20 0915  HGB 11.8* 11.7*  11.4* 11.2*   Recent Labs    06/02/20 0401 06/03/20 0915  WBC 7.9  7.9 6.6  RBC 4.60  4.59 4.52  HCT 36.7*  36.4* 35.3*  PLT 125*  128* 153   No results for input(s): NA, K, CL, CO2, BUN, CREATININE, GLUCOSE, CALCIUM in the last 72 hours. No results for input(s): LABPT, INR in the last 72 hours.  Incision: dressing C/D/I   Assessment/Plan: 3 Days Post-Op Procedure(s) (LRB): OPEN REDUCTION INTERNAL FIXATION (ORIF) TIBIAL PLATEAU (Left)  Injuries:1. Left bicondylar tibial plateau fracture s/p ORIF 2. Left lateral meniscus tear s/p repair 3. Right medial malleolus fracture s/p closed treatment 4. Possible acute right lateral tibial plateau fracture                  Weightbearing: NWB LLE, WBAT RLE                  Insicional and dressing care: Okay to leave incisions open to air                  Orthopedic device(s): CAM boot RLE  CV/Blood loss: Hgb 11.2 this morning, stable  Pain management:  1. Tylenol 650 mg q 6 hours scheduled 2. Robaxin 500 mg q 6 hours PRN 3. Oxycodone 5-15 mg q 4 hours PRN 4. Morphine 2 mg q 4 hours PRN  VTE prophylaxis: Lovenox   ID:  Ancef 2gm post op completed  Foley/Lines: No foley, KVO IVFs  Medical co-morbidities: None noted  Impediments to Fracture Healing:  Vit D  level 12, start on D3 and D2 supplementation.   Dispo: Therapies as tolerated.  PT/OT recommending CIR.  Have placed consult for rehab MD to evaluate  Follow - up plan:  TBD  [name] 06/03/2020, 1:26 PM
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
## 2020-06-04 NOTE — Plan of Care (Signed)

## 2020-06-04 NOTE — Progress Notes (Addendum)
Inpatient Rehab Admissions Coordinator:   I have spoken with pt.'s sister and his aunt and they state that they are not able to take pt. In to live with them following discharge. Pt. Currently does not have a safe discharge plan, as he was living alone in a long-term motel prior to admission. I spoke  to patient again and he states that he has no other family or friends who he could stay with after discharge.    Megan Salon, MS, CCC-SLP Rehab Admissions Coordinator  [phone] (celll) [phone] (office)

## 2020-06-05 LAB — SARS CORONAVIRUS 2 (TAT 6-24 HRS): SARS Coronavirus 2: NEGATIVE

## 2020-06-05 MED ORDER — OXYCODONE HCL 5 MG PO TABS
5.0000 mg | ORAL_TABLET | ORAL | Status: DC | PRN
  Administered 2020-06-05 (×2): 10 mg via ORAL
  Filled 2020-06-05 (×2): qty 2
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
## 2020-06-05 NOTE — Plan of Care (Signed)
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
## 2020-06-05 NOTE — Progress Notes (Signed)
Inpatient Rehab Admissions Coordinator:    [facility] cannot accept pt. Without a stable plan for d/c. I notified patient and he affirmed that he has no one to assist him or that he could stay with at d/c. I have communicated with Case Manager, who stated plans to offer LOG bed at SNF. CIR will sign off.   Megan Salon, MS, CCC-SLP Rehab Admissions Coordinator  [phone] (celll) [phone] (office)
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
## 2020-06-06 LAB — CREATININE, SERUM
Creatinine, Ser: 0.99 mg/dL (ref 0.61–1.24)
GFR calc Af Amer: >60 mL/min (ref >60)
GFR calc non Af Amer: >60 mL/min (ref >60)

## 2020-06-06 MED ORDER — ENOXAPARIN SODIUM 40 MG/0.4ML ~~LOC~~ SOLN: 40.0000 mg | SUBCUTANEOUS | 0 refills | Status: AC

## 2020-06-06 MED ORDER — VITAMIN D (ERGOCALCIFEROL) 1.25 MG (50000 UNIT) PO CAPS: 50000.0000 [IU] | ORAL_CAPSULE | ORAL | 0 refills | Status: AC

## 2020-06-06 MED ORDER — OXYCODONE HCL 5 MG PO TABS
5.0000 mg | ORAL_TABLET | Freq: Four times a day (QID) | ORAL | 0 refills | Status: AC | PRN
Start: 2020-06-06 — End: ?

## 2020-06-06 MED ORDER — METHOCARBAMOL 500 MG PO TABS: 500.0000 mg | ORAL_TABLET | Freq: Four times a day (QID) | ORAL | 0 refills | Status: AC | PRN

## 2020-06-06 MED ORDER — VITAMIN D3 25 MCG PO TABS
2000.0000 [IU] | ORAL_TABLET | Freq: Two times a day (BID) | ORAL | 1 refills | Status: AC
Start: 2020-06-06 — End: ?

## 2020-06-06 NOTE — Discharge Instructions (Signed)
Orthopaedic Trauma Service Discharge Instructions   General Discharge Instructions  WEIGHT BEARING STATUS: Nonweightbearing left lower extremity, weightbearing as tolerated right lower extremity  RANGE OF MOTION/ACTIVITY: Okay for unrestricted range of motion bilateral lower extremities.  Okay to come out of CAM boot at rest.  Should have CAM boot on right lower extremity when ambulating  Wound Care: Incisions can be left open to air if there is no drainage. If incision continues to have drainage, follow wound care instructions below. Okay to shower if no drainage from incisions.  DVT/PE prophylaxis: Lovenox  Diet: as you were eating previously.  Can use over the counter stool softeners and bowel preparations, such as Miralax, to help with bowel movements.  Narcotics can be constipating.  Be sure to drink plenty of fluids  PAIN MEDICATION USE AND EXPECTATIONS  You have likely been given narcotic medications to help control your pain.  After a traumatic event that results in an fracture (broken bone) with or without surgery, it is ok to use narcotic pain medications to help control one's pain.  We understand that everyone responds to pain differently and each individual patient will be evaluated on a regular basis for the continued need for narcotic medications. Ideally, narcotic medication use should last no more than 6-8 weeks (coinciding with fracture healing).   As a patient it is your responsibility as well to monitor narcotic medication use and report the amount and frequency you use these medications when you come to your office visit.   We would also advise that if you are using narcotic medications, you should take a dose prior to therapy to maximize you participation.  IF YOU ARE ON NARCOTIC MEDICATIONS IT IS NOT PERMISSIBLE TO OPERATE A MOTOR VEHICLE (MOTORCYCLE/CAR/TRUCK/MOPED) OR HEAVY MACHINERY DO NOT MIX NARCOTICS WITH OTHER CNS (CENTRAL NERVOUS SYSTEM) DEPRESSANTS SUCH AS  ALCOHOL   STOP SMOKING OR USING NICOTINE PRODUCTS!!!!  As discussed nicotine severely impairs your body's ability to heal surgical and traumatic wounds but also impairs bone healing.  Wounds and bone heal by forming microscopic blood vessels (angiogenesis) and nicotine is a vasoconstrictor (essentially, shrinks blood vessels).  Therefore, if vasoconstriction occurs to these microscopic blood vessels they essentially disappear and are unable to deliver necessary nutrients to the healing tissue.  This is one modifiable factor that you can do to dramatically increase your chances of healing your injury.    (This means no smoking, no nicotine gum, patches, etc)  DO NOT USE NONSTEROIDAL ANTI-INFLAMMATORY DRUGS (NSAID'S)  Using products such as Advil (ibuprofen), Aleve (naproxen), Motrin (ibuprofen) for additional pain control during fracture healing can delay and/or prevent the healing response.  If you would like to take over the counter (OTC) medication, Tylenol (acetaminophen) is ok.  However, some narcotic medications that are given for pain control contain acetaminophen as well. Therefore, you should not exceed more than 4000 mg of tylenol in a day if you do not have liver disease.  Also note that there are may OTC medicines, such as cold medicines and allergy medicines that my contain tylenol as well.  If you have any questions about medications and/or interactions please ask your doctor/PA or your pharmacist.      ICE AND ELEVATE INJURED/OPERATIVE EXTREMITY  Using ice and elevating the injured extremity above your heart can help with swelling and pain control.  Icing in a pulsatile fashion, such as 20 minutes on and 20 minutes off, can be followed.    Do not place ice directly  on skin. Make sure there is a barrier between to skin and the ice pack.    Using frozen items such as frozen peas works well as the conform nicely to the are that needs to be iced.  USE AN ACE WRAP OR TED HOSE FOR SWELLING  CONTROL  In addition to icing and elevation, Ace wraps or TED hose are used to help limit and resolve swelling.  It is recommended to use Ace wraps or TED hose until you are informed to stop.    When using Ace Wraps start the wrapping distally (farthest away from the body) and wrap proximally (closer to the body)   Example: If you had surgery on your leg or thing and you do not have a splint on, start the ace wrap at the toes and work your way up to the thigh        If you had surgery on your upper extremity and do not have a splint on, start the ace wrap at your fingers and work your way up to the upper arm   IF YOU ARE IN A CAM BOOT (BLACK BOOT)  You may remove boot periodically. Perform daily dressing changes as noted below.  Wash the liner of the boot regularly and wear a sock when wearing the boot. It is recommended that you sleep in the boot until told otherwise   CALL THE OFFICE WITH ANY QUESTIONS OR CONCERNS: [phone]   VISIT OUR WEBSITE FOR ADDITIONAL INFORMATION: [email or web address]      Discharge Wound Care Instructions  Do NOT apply any ointments, solutions or lotions to pin sites or surgical wounds.  These prevent needed drainage and even though solutions like hydrogen peroxide kill bacteria, they also damage cells lining the pin sites that help fight infection.  Applying lotions or ointments can keep the wounds moist and can cause them to breakdown and open up as well. This can increase the risk for infection. When in doubt call the office.  Surgical incisions should be dressed daily.  If any drainage is noted, use one layer of adaptic, then gauze, Kerlix, and an ace wrap.  Once the incision is completely dry and without drainage, it may be left open to air out.  Showering may begin 36-48 hours later.  Cleaning gently with soap and water.  Traumatic wounds should be dressed daily as well.    One layer of adaptic, gauze, Kerlix, then ace wrap.  The adaptic can be  discontinued once the draining has ceased    If you have a wet to dry dressing: wet the gauze with saline the squeeze as much saline out so the gauze is moist (not soaking wet), place moistened gauze over wound, then place a dry gauze over the moist one, followed by Kerlix wrap, then ace wrap.

## 2020-06-06 NOTE — Plan of Care (Signed)

## 2020-06-06 NOTE — Plan of Care (Signed)
  Problem: Education: Goal: Knowledge of General Education information will improve Description: Including pain rating scale, medication(s)/side effects and non-pharmacologic comfort measures 06/06/2020 0937 by Penelope Galas, RN Outcome: Progressing 06/06/2020 0759 by Penelope Galas, RN Outcome: Progressing   Problem: Health Behavior/Discharge Planning: Goal: Ability to manage health-related needs will improve 06/06/2020 0937 by Penelope Galas, RN Outcome: Progressing 06/06/2020 0759 by Penelope Galas, RN Outcome: Progressing   Problem: Clinical Measurements: Goal: Ability to maintain clinical measurements within normal limits will improve 06/06/2020 0937 by Penelope Galas, RN Outcome: Progressing 06/06/2020 0759 by Penelope Galas, RN Outcome: Progressing Goal: Will remain free from infection 06/06/2020 0937 by Penelope Galas, RN Outcome: Progressing 06/06/2020 0759 by Penelope Galas, RN Outcome: Progressing Goal: Diagnostic test results will improve 06/06/2020 0937 by Penelope Galas, RN Outcome: Progressing 06/06/2020 0759 by Penelope Galas, RN Outcome: Progressing Goal: Respiratory complications will improve 06/06/2020 0937 by Penelope Galas, RN Outcome: Progressing 06/06/2020 0759 by Penelope Galas, RN Outcome: Progressing Goal: Cardiovascular complication will be avoided 06/06/2020 0937 by Penelope Galas, RN Outcome: Progressing 06/06/2020 0759 by Penelope Galas, RN Outcome: Progressing   Problem: Activity: Goal: Risk for activity intolerance will decrease 06/06/2020 0937 by Penelope Galas, RN Outcome: Progressing 06/06/2020 0759 by Penelope Galas, RN Outcome: Progressing   Problem: Nutrition: Goal: Adequate nutrition will be maintained 06/06/2020 0937 by Penelope Galas, RN Outcome: Progressing 06/06/2020 0759 by Penelope Galas, RN Outcome: Progressing   Problem: Coping: Goal: Level of anxiety will decrease 06/06/2020 0937 by Penelope Galas, RN Outcome: Progressing 06/06/2020 0759 by Penelope Galas, RN Outcome: Progressing    Problem: Elimination: Goal: Will not experience complications related to bowel motility 06/06/2020 0937 by Penelope Galas, RN Outcome: Progressing 06/06/2020 0759 by Penelope Galas, RN Outcome: Progressing Goal: Will not experience complications related to urinary retention 06/06/2020 0937 by Penelope Galas, RN Outcome: Progressing 06/06/2020 0759 by Penelope Galas, RN Outcome: Progressing   Problem: Pain Managment: Goal: General experience of comfort will improve 06/06/2020 0937 by Penelope Galas, RN Outcome: Progressing 06/06/2020 0759 by Penelope Galas, RN Outcome: Progressing   Problem: Safety: Goal: Ability to remain free from injury will improve 06/06/2020 0937 by Penelope Galas, RN Outcome: Progressing 06/06/2020 0759 by Penelope Galas, RN Outcome: Progressing   Problem: Skin Integrity: Goal: Risk for impaired skin integrity will decrease 06/06/2020 0937 by Penelope Galas, RN Outcome: Progressing 06/06/2020 0759 by Penelope Galas, RN Outcome: Progressing

## 2020-06-06 NOTE — Plan of Care (Signed)

## 2020-06-06 NOTE — TOC Transition Note (Signed)
Transition of Care [facility]) - CM/SW Discharge Note   Patient Details  Name: Aaron Mcmillan MRN: [id number] Date of Birth: 03-30-1966  [facility] CM/SW Contact:  Janae Bridgeman, RN Phone Number: 06/06/2020, 11:27 AM   Clinical Narrative:    Case Management spoke with [facility] this morning and the patient will be able to transfer out to the facility at 1 pm today via [facility].  The patient was updated this morning about his transfer to the facility.  [facility] was called and set up for transport at 1 pm today.  Tu, primary nurse is going to call report to the facility at [phone] for Room 128 - Report to [place], [place] at [facility].   Final next level of care: Skilled Nursing Facility Barriers to Discharge: SNF Pending bed offer, SNF Pending payor source - LOG   Patient Goals and CMS Choice Patient states their goals for this hospitalization and ongoing recovery are:: Patient denied CIR placement and agreeable to [facility] placement with LOG pending CMS Medicare.gov Compare Post Acute Care list provided to:: Patient Choice offered to / list presented to : Patient  Discharge Placement                       Discharge Plan and Services   Discharge Planning Services: CM Consult Post Acute Care Choice: Skilled Nursing Facility                               Social Determinants of Health (SDOH) Interventions     Readmission Risk Interventions Readmission Risk Prevention Plan 06/05/2020  Post Dischage Appt Complete  Medication Screening Complete  Transportation Screening Complete

## 2020-06-06 NOTE — Progress Notes (Signed)
Discharge package printed and sent with patient. Called [place] x2  And lelt message to call back for report.
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
## 2021-02-14 IMAGING — RF DG KNEE 1-2V*L*
1 series · 10 of 10 positions shown · IV contrast (agent unspecified)
Comparison: 05/30/2020

CLINICAL DATA: Open reduction and internal fixation

EXAM:
DG C-ARM 1-60 MIN; LEFT KNEE - 1-2 VIEW
CONTRAST:  None
FLUOROSCOPY TIME:  Fluoroscopy Time:  1 minutes 53 seconds
Number of Acquired Spot Images: 10

[Series 1: run · 10 of 10 slices shown]
[im 1/10]
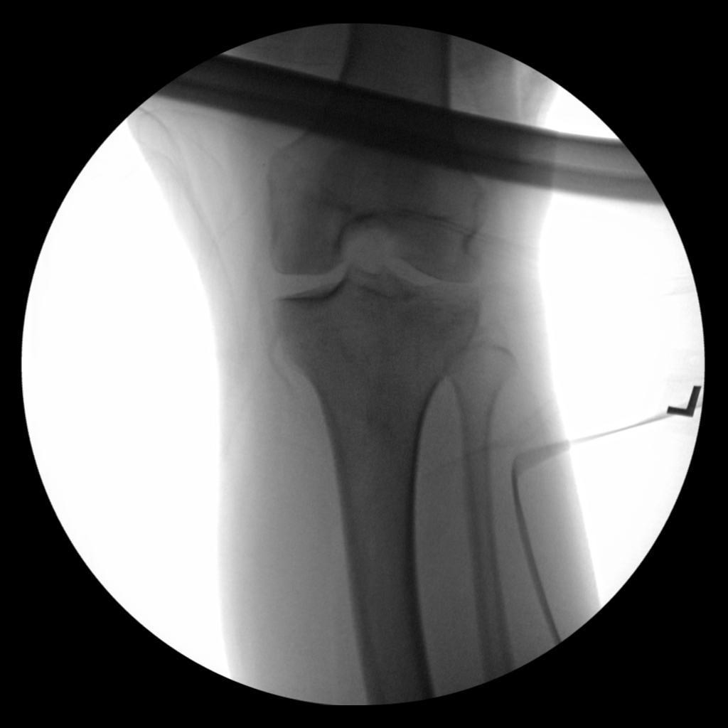
[im 2/10]
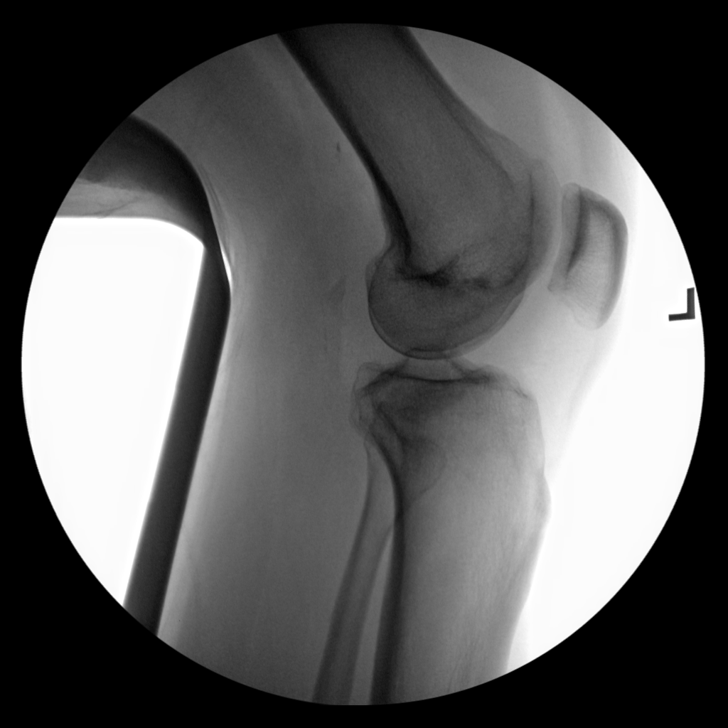
[im 3/10]
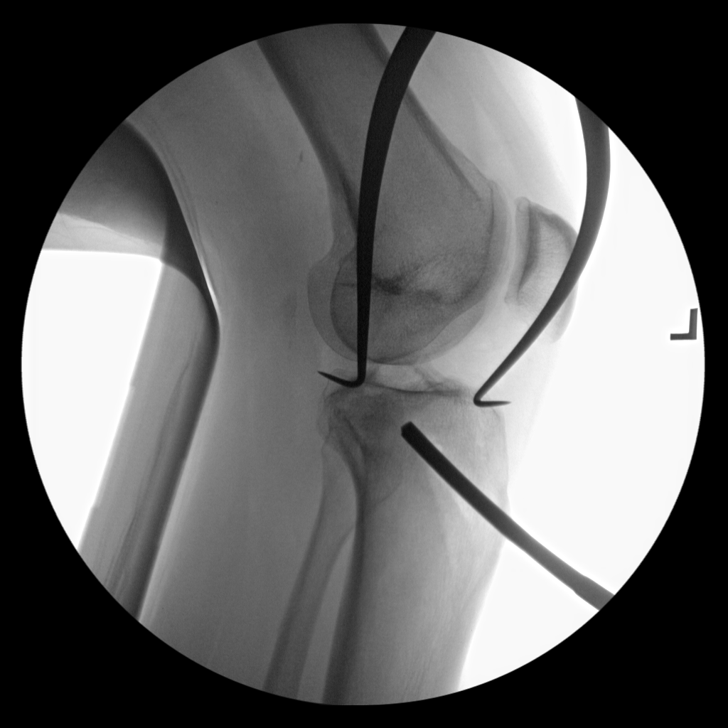
[im 4/10]
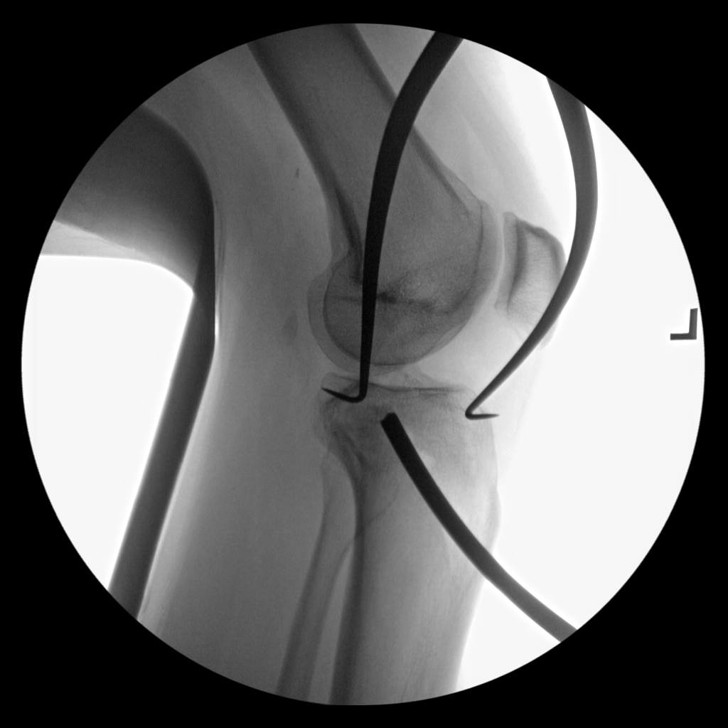
[im 5/10]
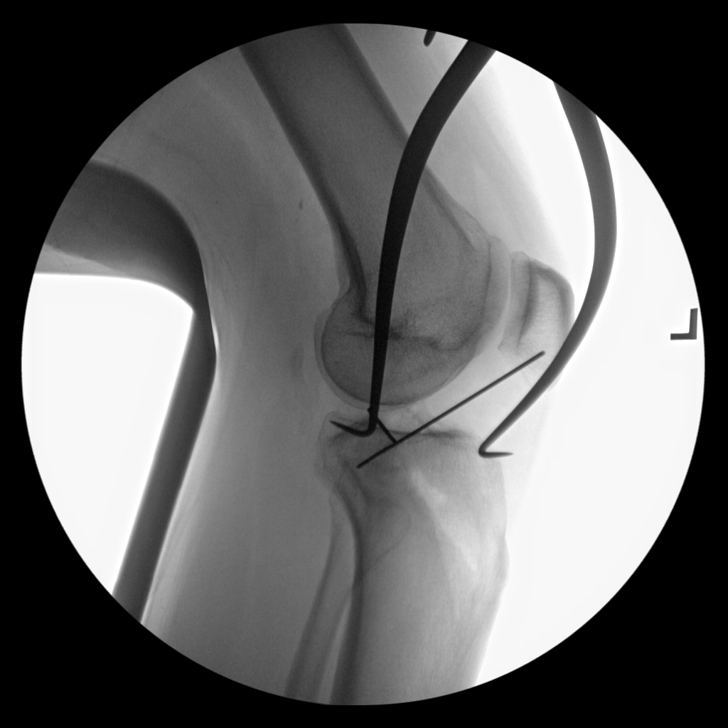
[im 6/10]
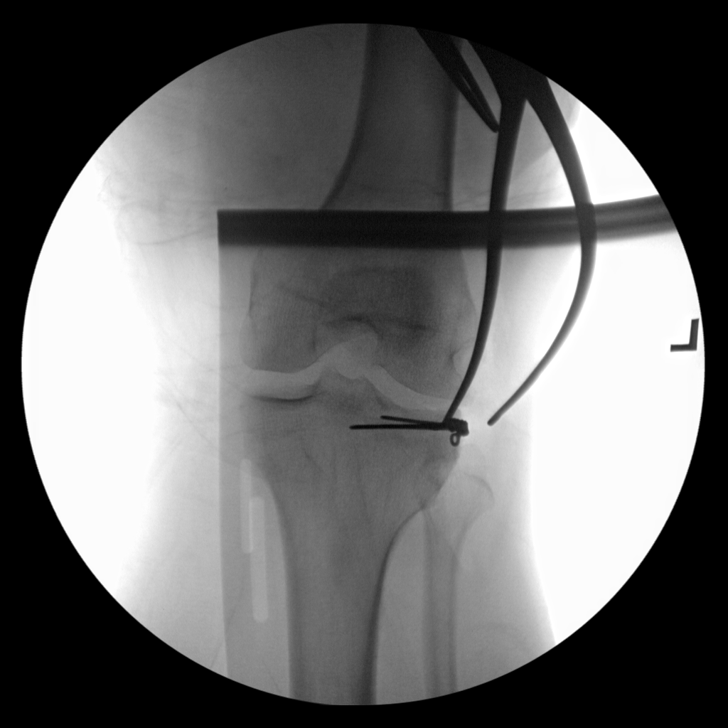
[im 7/10]
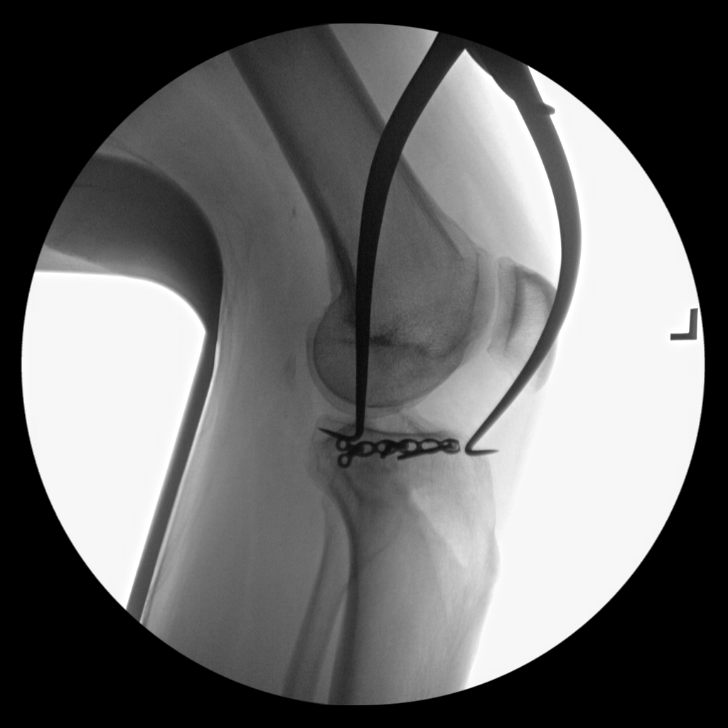
[im 8/10]
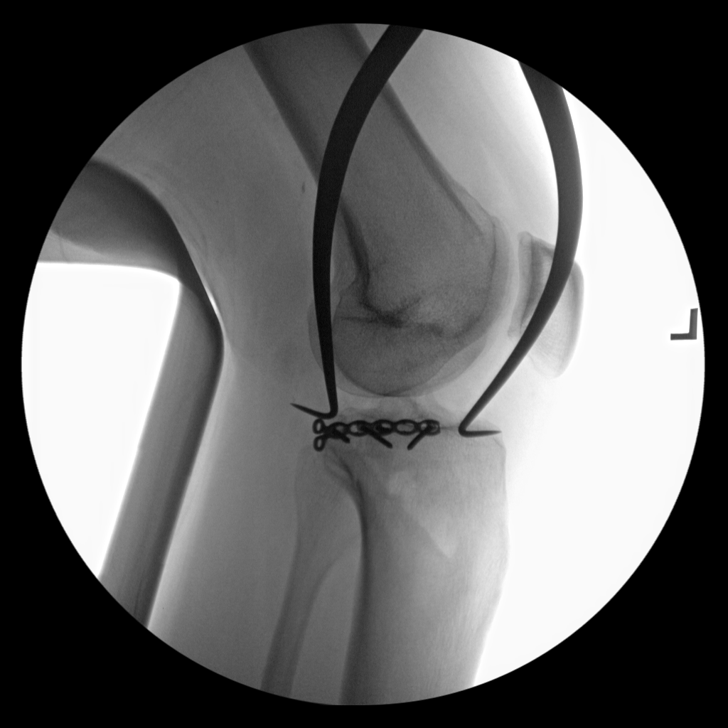
[im 9/10]
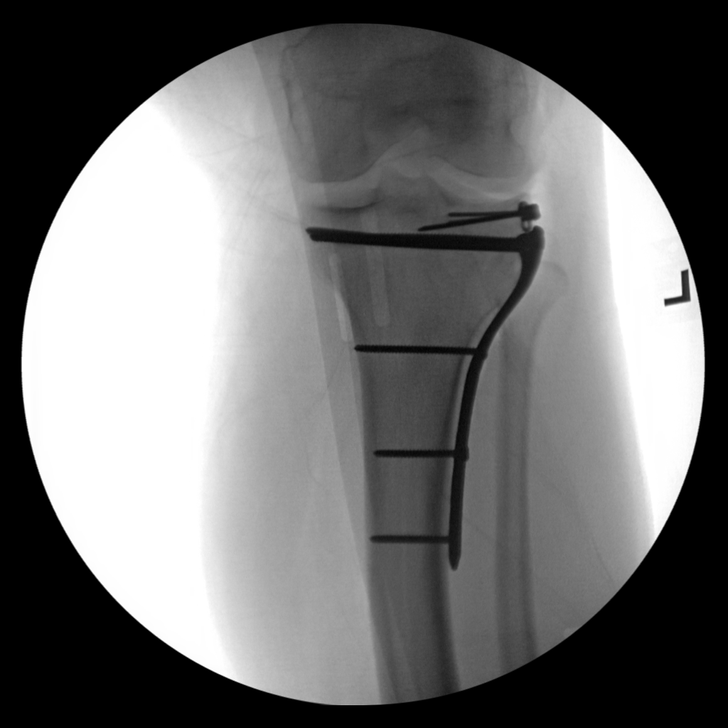
[im 10/10]
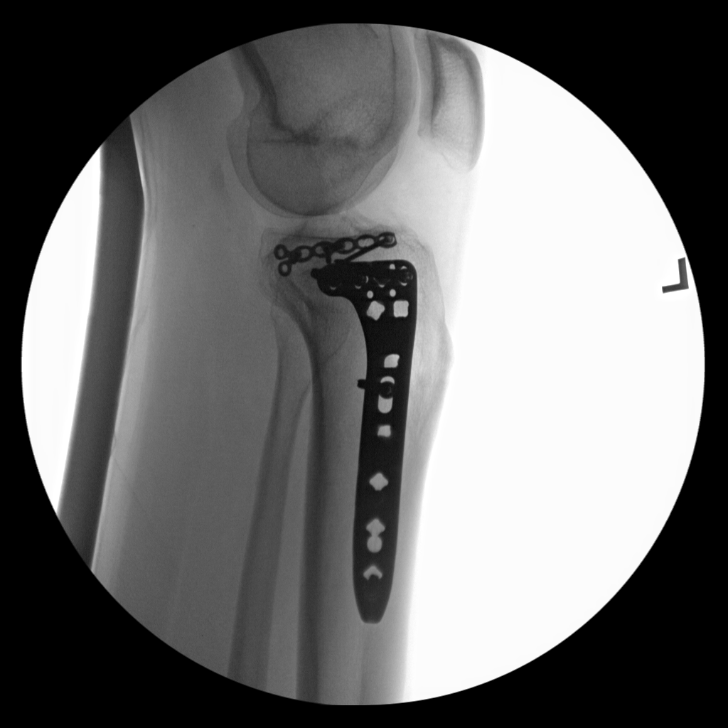

[10 of 10 positions shown; findings below may reference images not displayed]

FINDINGS: Ten low resolution intraoperative spot views of the left knee. The
images demonstrate surgical plate and screw fixation of lateral
tibial plateau fracture.
IMPRESSION: Intraoperative fluoroscopic assistance provided during surgical
fixation of lateral tibial plateau fracture.
